# Patient Record
Sex: Female | Born: 1995 | Race: White | Hispanic: No | Marital: Single | Smoking: Never smoker
Health system: Southern US, Community
[De-identification: ages and names within clinical notes are randomized; demographics above are authoritative.]

## PROBLEM LIST (undated history)

## (undated) DIAGNOSIS — E673 Hypervitaminosis D: Secondary | ICD-10-CM

## (undated) DIAGNOSIS — S5290XA Unspecified fracture of unspecified forearm, initial encounter for closed fracture: Secondary | ICD-10-CM

## (undated) DIAGNOSIS — F909 Attention-deficit hyperactivity disorder, unspecified type: Secondary | ICD-10-CM

## (undated) DIAGNOSIS — M8580 Other specified disorders of bone density and structure, unspecified site: Secondary | ICD-10-CM

## (undated) DIAGNOSIS — E063 Autoimmune thyroiditis: Secondary | ICD-10-CM

## (undated) DIAGNOSIS — F419 Anxiety disorder, unspecified: Secondary | ICD-10-CM

## (undated) DIAGNOSIS — R6250 Unspecified lack of expected normal physiological development in childhood: Secondary | ICD-10-CM

## (undated) DIAGNOSIS — E079 Disorder of thyroid, unspecified: Secondary | ICD-10-CM

## (undated) DIAGNOSIS — E049 Nontoxic goiter, unspecified: Secondary | ICD-10-CM

## (undated) DIAGNOSIS — S52209A Unspecified fracture of shaft of unspecified ulna, initial encounter for closed fracture: Secondary | ICD-10-CM

## (undated) HISTORY — DX: Other specified disorders of bone density and structure, unspecified site: M85.80

## (undated) HISTORY — PX: APPENDECTOMY: SHX54

## (undated) HISTORY — DX: Disorder of thyroid, unspecified: E07.9

## (undated) HISTORY — DX: Unspecified lack of expected normal physiological development in childhood: R62.50

## (undated) HISTORY — DX: Nontoxic goiter, unspecified: E04.9

## (undated) HISTORY — DX: Autoimmune thyroiditis: E06.3

## (undated) HISTORY — DX: Hypervitaminosis D: E67.3

## (undated) HISTORY — DX: Anxiety disorder, unspecified: F41.9

## (undated) HISTORY — DX: Unspecified fracture of unspecified forearm, initial encounter for closed fracture: S52.90XA

## (undated) HISTORY — DX: Attention-deficit hyperactivity disorder, unspecified type: F90.9

## (undated) HISTORY — DX: Unspecified fracture of shaft of unspecified ulna, initial encounter for closed fracture: S52.209A

---
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
## 2017-05-16 ENCOUNTER — Telehealth (INDEPENDENT_AMBULATORY_CARE_PROVIDER_SITE_OTHER): Payer: Self-pay | Admitting: "Endocrinology

## 2017-05-16 NOTE — Telephone Encounter (Signed)
°  Who's calling (name and relationship to patient) : Mom/Dana  Best contact number: [phone]  Provider they see: Dr Fransico MichaelBrennan  Reason for call: Mom called in requesting to have lab orders released, pt is going to [facility] on Monday(05/19/17) to have labs drawn and just needed to make sure labs were ready when pt arrives to the lab.

## 2017-05-19 ENCOUNTER — Other Ambulatory Visit (INDEPENDENT_AMBULATORY_CARE_PROVIDER_SITE_OTHER): Payer: Self-pay

## 2017-05-19 DIAGNOSIS — E673 Hypervitaminosis D: Secondary | ICD-10-CM

## 2017-05-20 LAB — CBC WITH DIFFERENTIAL/PLATELET
BASOS ABS: 20 {cells}/uL (ref 0–200)
Basophils Relative: 0.3 %
EOS PCT: 3.2 %
Eosinophils Absolute: 218 cells/uL (ref 15–500)
HCT: 38 % (ref 35.0–45.0)
HEMOGLOBIN: 13.4 g/dL (ref 11.7–15.5)
Lymphs Abs: 1510 cells/uL (ref 850–3900)
MCH: 30 pg (ref 27.0–33.0)
MCHC: 35.3 g/dL (ref 32.0–36.0)
MCV: 85.2 fL (ref 80.0–100.0)
MONOS PCT: 6.5 %
MPV: 10 fL (ref 7.5–12.5)
NEUTROS ABS: 4610 {cells}/uL (ref 1500–7800)
Neutrophils Relative %: 67.8 %
Platelets: 268 10*3/uL (ref 140–400)
RBC: 4.46 10*6/uL (ref 3.80–5.10)
RDW: 12.4 % (ref 11.0–15.0)
Total Lymphocyte: 22.2 %
WBC mixed population: 442 cells/uL (ref 200–950)
WBC: 6.8 10*3/uL (ref 3.8–10.8)

## 2017-05-20 LAB — PTH, INTACT AND CALCIUM
Calcium: 9.4 mg/dL (ref 8.9–10.4)
PTH: 31 pg/mL (ref 14–64)

## 2017-05-20 LAB — TSH: TSH: 1.73 m[IU]/L

## 2017-05-20 LAB — T3, FREE: T3 FREE: 3.5 pg/mL (ref 3.0–4.7)

## 2017-05-20 LAB — IRON: IRON: 32 ug/dL (ref 27–164)

## 2017-05-20 LAB — VITAMIN D 25 HYDROXY (VIT D DEFICIENCY, FRACTURES): Vit D, 25-Hydroxy: 38 ng/mL (ref 30–100)

## 2017-05-20 LAB — T4, FREE: FREE T4: 1.4 ng/dL (ref 0.8–1.4)

## 2017-05-21 ENCOUNTER — Ambulatory Visit (INDEPENDENT_AMBULATORY_CARE_PROVIDER_SITE_OTHER): Payer: BLUE CROSS/BLUE SHIELD | Admitting: "Endocrinology

## 2017-05-21 ENCOUNTER — Encounter (INDEPENDENT_AMBULATORY_CARE_PROVIDER_SITE_OTHER): Payer: Self-pay | Admitting: "Endocrinology

## 2017-05-21 VITALS — BP 112/64 | HR 90 | Ht 66.34 in | Wt 144.0 lb

## 2017-05-21 DIAGNOSIS — E063 Autoimmune thyroiditis: Secondary | ICD-10-CM | POA: Diagnosis not present

## 2017-05-21 DIAGNOSIS — E673 Hypervitaminosis D: Secondary | ICD-10-CM | POA: Diagnosis not present

## 2017-05-21 DIAGNOSIS — R7989 Other specified abnormal findings of blood chemistry: Secondary | ICD-10-CM

## 2017-05-21 DIAGNOSIS — T07XXXA Unspecified multiple injuries, initial encounter: Secondary | ICD-10-CM

## 2017-05-21 DIAGNOSIS — D500 Iron deficiency anemia secondary to blood loss (chronic): Secondary | ICD-10-CM

## 2017-05-21 DIAGNOSIS — E049 Nontoxic goiter, unspecified: Secondary | ICD-10-CM | POA: Diagnosis not present

## 2017-05-21 NOTE — Patient Instructions (Signed)
Follow up visit in 6 months. Please repeat lab tests two weeks prior. Take one Centrum for women or One-A-Day for Women daily.

## 2017-05-21 NOTE — Progress Notes (Signed)
Subjective:  Patient Name: Bethany Dean Date of Birth: Oct 15, 1995  MRN: [id number]  Bethany Dean  presents to the office today for follow-up of her fractures, goiter, abnormal thyroid tests, fatigue, thyroiditis, hypervitaminosis D,  and iron deficiency anemia.  HISTORY OF PRESENT ILLNESS:   Bethany Dean is a 21 y.o. Caucasian young lady.  Bethany Dean was unaccompanied.  1. Patient was first referred to me on 07/23/2004 by her primary care pediatrician, Dr. Darrin NipperKathleen Riley [place], after having had four fractures within 18 months in the setting of a family history of osteoporosis.   A. The child was then 746-1/63 years old. The perinatal history showed that this was the third child for this mother. Child was born at term with a birth weight of 9 lbs. 13 oz. She took vitamin D supplementation while she was breast fed until age 72-1/2 months, but then was without any multivitamins or vitamin D at all. Her first fracture occurred when she was 21 years old. This was a fracture of the left radius, which occurred after a fall from gymnastic rings. She had a second fracture at the original site 3 months later during a sliding accident. She had a third fracture when she fell backward off a chair and fractured her left wrist. Her fourth fracture within 18 months occurred when she tripped on a post and fell forward, fracturing her right ulna. Her dietary intake of calcium had been low, because she had been chronically constipated since infancy. She didn't drink much milk. She also didn't eat much cheese or ice cream. Dr. Victory Dakiniley had started her on Os-Cal, 500 mg twice daily 2 weeks prior to her visit. The child was a very active little girl. She played on a local soccer team. Family history was positive for osteoporosis in the maternal grandmother and maternal great-grandmother. Both were on Fosamax. The maternal great grandmother had also had a fracture. Paternal grandmother and paternal grandfather had  diabetes. Paternal grandfather and maternal grandmother both had thyroid disease.   B. On physical examination her height was at about the 78th percentile. Her weight was at about the 53rd percentile. Her thyroid gland was borderline enlarged. Her right arm was then in a cast. She had normal muscle strength and tone. Laboratory data showed normal thyroid tests. Calcium was 10.5 and PTH was 9.1. Her 25-hydroxy vitamin D was 82. Her 1,25-dihydroxy vitamin D was 33.  Although these laboratory tests were obtained several weeks after beginning of Os-Cal with D, it did not appear as if she had ever had hyperparathyroidism or very severe calcium and vitamin D deficiency. Her BMD study performed on 08/07/04 showed that her total body and spine BMD were normal for her chronologic age. It was possible that she just had had the bad luck to have several fractures of her wrists and forearms as a result of trauma. I decided to follow her clinically over time.   2. During the past 12 years her baseline normal bone mineral density has gradually but progressively improved. A follow up bone mineral density study on 08/15/2005 showed that her overall total bone density had improved. The overall total body bone density was at approximately the 50th percentile. Her bone density of the lumbar spine was at about the 80th percentile. A third bone density study on 08/11/2007 showed an even greater improvement in bone mineral density. She did have elevated vitamin D levels for a period of  time, but the levels normalized once her vitamin D intake was  reduced. During this period of time the patient has had waxing and waning of her thyroid gland size consistent with Hashimoto's thyroiditis. She's also had fluctuations of her thyroid tests consistent with Hashimoto's disease. She has generally remained euthyroid, however. She was diagnosed with ADHD several  years ago. She has been treated with Concerta and Intuniv, but the Intuniv was later  discontinued..  3. The patient's last [facility] visit was on 11/19/16. In the interim, she has been healthy. She has not had any more fractures. She takes her Gummi vitamins regularly. She is eating well. She stays active and walks a lot. She only takes Concerta when she has tests.   4. Pertinent Review of Systems:  Constitutional: The patient feels "good".  She has not been fatigued. Her stamina is good. Energy level is good. Body temperature is normal.  Eyes: Vision is better when she wears her glasses. There are no other recognized eye problems. Neck: She has had some superior sore throat symptoms recently, but no anterior neck symptoms. She has no complaints of anterior neck swelling, soreness, tenderness, pressure, discomfort, or difficulty swallowing.   Heart: Heart rate increases with exercise or other physical activity. The patient has no complaints of palpitations, irregular heart beats, chest pain, or chest pressure.   Gastrointestinal: Bowel movents seem normal. The patient has no complaints of excessive hunger, acid reflux, upset stomach, stomach aches or pains, diarrhea, or constipation.  Legs: Muscle mass and strength seem normal. There are no complaints of numbness, tingling, burning, or pain. No edema is noted.  Feet: There are no obvious foot problems now. There are no complaints of numbness, tingling, burning, or pain. No edema is noted. Neurologic: There are no recognized problems with muscle movement and strength, sensation, or coordination. GYN: LMP was 2 weeks ago. Menses have been regular at every 4 weeks. Periods are not too heavy. She is not using birth control.   PAST MEDICAL, FAMILY, AND SOCIAL HISTORY  Past Medical History:  Diagnosis Date  . ADHD (attention deficit hyperactivity disorder)   . Forearm fractures, both bones, closed   . Goiter   . Hashimoto's disease   . Hypervitaminosis D   . Osteopenia   . Physical growth delay   . Physical growth delay   .  Thyroiditis, autoimmune     Family History  Problem Relation Age of Onset  . Diabetes Maternal Grandmother   . Osteoporosis Maternal Grandmother   . Hypothyroidism Maternal Grandmother   . Thyroid disease Maternal Grandmother   . Hypothyroidism Maternal Grandfather   . Thyroid disease Maternal Grandfather   . Diabetes Paternal Grandfather      Current Outpatient Medications:  .  methylphenidate (CONCERTA) 54 MG CR tablet, Take 72 mg by mouth every morning.  , Disp: , Rfl:  .  methylphenidate 36 MG PO CR tablet, Take 36 mg by mouth daily., Disp: , Rfl:  .  oxyCODONE (OXY IR/ROXICODONE) 5 MG immediate release tablet, Take 1-2 tablets (5-10 mg total) by mouth every 4 (four) hours as needed for moderate pain. (Patient not taking: Reported on 11/19/2016), Disp: 30 tablet, Rfl: 0  Allergies as of 05/21/2017  . (No Known Allergies)    reports that  has never smoked. she has never used smokeless tobacco. She reports that she does not drink alcohol or use drugs. Pediatric History  Patient Guardian Status  . Mother:  Seleny, Allbright  . Father:  Lezlie, Ritchey   Other Topics Concern  . Not on file  Social History  Narrative   Is in 8th grade at [employer]   Lives with parents, and 2 sisters   Plays tennis   1. 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 She is no longer sure if she wants to be a Clinical research associate, so she is taking more business administration courses now. Her paternal aunt was previously diagnosed with lupus. Both maternal grandparents had thyroid disease. Her boyfriend is a Chiropractor who is a Holiday representative. He will be commissioned in the [employer] after graduation. He wants to be an Administrator, sports.  2. Activities: She walks everywhere at school. She still plays the keyboard, but not as much. She no longer plays much tennis. She is very active. 3. Primary Care Provider: Dr. Earlene Plater, [facility]  REVIEW OF SYSTEMS: There are no other significant problems involving Dajuana's other body systems.   Objective:  Vital Signs:  Ht 5' 6.34" (1.685 m)   Wt 144 lb (65.3 kg)   BMI 23.01 kg/m    Ht Readings from Last 3 Encounters:  05/21/17 5' 6.34" (1.685 m) (79 %, Z= 0.81)*  11/19/16 5' 6.46" (1.688 m) (81 %, Z= 0.86)*  06/29/16 5\' 6"  (1.676 m) (75 %, Z= 0.69)*   * Growth percentiles are based on CDC (Girls, 2-20 Years) data.   Wt Readings from Last 3 Encounters:  05/21/17 144 lb (65.3 kg) (76 %, Z= 0.69)*  11/19/16 127 lb (57.6 kg) (53 %, Z= 0.06)*  06/29/16 126 lb 15.8 oz (57.6 kg) (54 %, Z= 0.11)*   * Growth percentiles are based on CDC (Girls, 2-20 Years) data.   Body surface area is 1.75 meters squared.  PHYSICAL EXAM:  Constitutional: The patient appears healthy, relatively tall, but no longer quite slender. The patient's height has plateaued at about the 79.04%. Her weight has increased 17 pounds and her weight percentile has increased to the 75.64%. Her BMI has increased to the 65.32%. She is bright and alert. She engages well. Her affect and insight are quite normal. She looks good today. Head: The head is normocephalic. Face: The face appears normal. There are no obvious dysmorphic features. She has mild acne. Eyes: There is no obvious arcus or proptosis. Moisture appears normal. Mouth: The oropharynx and tongue appear normal. Dentition appears to be normal for age. Oral moisture is normal. Neck: The neck appears to be visibly normal. No carotid bruits are noted. The thyroid gland has shrunk back to normal size of 18-20 grams. The consistency of the thyroid gland is normal. The thyroid gland is not tender to palpation. Lungs: The lungs are clear to auscultation. Air movement is good. Heart: Heart rate and rhythm are regular. Heart sounds S1 and S2 are normal. I did not appreciate any pathologic cardiac murmurs. Abdomen: The abdomen is normal in size for the  patient's age. Bowel sounds are normal. There is no obvious hepatomegaly, splenomegaly, or other mass effect.  Arms: Muscle size and bulk are normal for age. Hands: There is no obvious tremor. Phalangeal and metacarpophalangeal joints are normal. Palmar muscles are normal for age. Palmar skin is pale. Palmar moisture is normal.  Legs: Muscles appear normal for age. No edema is present. Neurologic: Strength is normal for age in both the upper and lower extremities. Muscle tone is normal. Sensation to touch is normal in both legs.    LAB DATA:   Labs 05/19/17: TSH 1.73, free T4  1.4, free T3 1.5; PTH 31, calcium 9.4, 25-OH vitamin D 38; iron 32; CBC normal  Labs 11/14/16: TSH 2.17, free T4 1.3, free T3 3.9; calcium 9.4, PTH 29, 25-OH vitamin D 51;  iron 38 (decreased from 55), CBC normal  Labs 04/24/16: TSH 1.41, free T4 1.4, free T3 3.1  Labs 05/08/15: TSH 2.232, free T4 1.41, free T3 3.1, TPO antibody 52 (normal <9)  Labs 10/27/14: PTH 36, calcium 9.4, 25-OH vitamin D 44; TSH 1.543, free T4 1.36, free T3 3.7  Labs 04/18/14: PTH 40, calcium 9.6, 25-hydroxy vitamin D 45; TSH 2.099, free T4 1.37, free T3 3.9  Labs 01/04/13: Calcium 9.8, PTH 22.7, 25-hydroxy vitamin D 87 (increased from 67); TSH 3.372, free T4 1.41, free T3 4.1  Labs 03/12/12: Free T4 1.21, free T3 4.3, HbA1c 5.5%, PTH 25.7, calcium 10.1, 25-hydroxy vitamin D 49  Labs 07/31/11: TSH 1.746. Free T4 1.54. Free T3 4.2;  PTH 25.2. Calcium 9.9, 25-hydroxy vitamin D 49   Assessment and Plan:   ASSESSMENT:  1-3. Goiter/abnormal thyroid blood tests/thyroiditis:   A. The thyroid gland has varied significantly in size over time, sometimes larger and sometimes smaller. Today the thyroid gland has shrunk back to normal size. The waxing and waning of thyroid gland size is c/w evolving Hashimoto's thyroiditis.   B. Her TPO antibody in December 2016 was elevated, c/w Hashimoto's disease. Her thyroiditis is clinically quiescent today  C. Her  TFTs in the past 4 years have varied from mid-range normal to mildly hypothyroid. Those shifts are also c/w evolving Hashimoto's Dz. Her TFTs are mid-normal now.   D. It is still quite likely that she will become hypothyroid at some time in the future.  4. Weight loss: Resolved  5. Poor appetite: Resolved. Her appetite is too good.    6. Hypervitaminosis D:   A. Her vitamin D level has also varied over time, but has been normal for the past 5 years. The vitamin D level is lower this month.   B. In the past she appeared to be a rapid absorber of vitamin D and/or an active producer of vitamin D when her skin is exposed to the sun. 7. Fractures: This problem has resolved. 8. Fatigue: Resolved  9. Pallor: Resolved.  10 Iron deficiency anemia: Her CBC is normal now, but her iron has decreased more.S he needs an MVI with iron daily, such as Centrum for Women or One-a-Day for Women,  PLAN:  1. Diagnostic:  Repeat the calcium, PTH, 25 hydroxy vitamin D, TFTs, CBC, and iron in 6 months.   2. Therapeutic: Continue what she is doing in terms of consuming dairy products. I would like her to resume taking an MVI daily to ensure that she has enough calcium and vitamin D to continue to increase her bone density during the young adult years and to ensure that she has enough iron intake to compensate for the loss of iron during menses.  3. Patient education: We talked about the issues of thyroiditis, hypothyroidism, bone mineral accretion, and iron deficiency anemia.   4. Follow-up: 6 months  Level of Service: This visit lasted in excess of 50 minutes. More than 50% of the visit was devoted to counseling.  Molli KnockMichael Lenni Reckner, MD, CDE Adult and Pediatric Endocrinology

## 2017-06-02 ENCOUNTER — Encounter (INDEPENDENT_AMBULATORY_CARE_PROVIDER_SITE_OTHER): Payer: Self-pay | Admitting: *Deleted

## 2017-10-22 ENCOUNTER — Telehealth (INDEPENDENT_AMBULATORY_CARE_PROVIDER_SITE_OTHER): Payer: Self-pay | Admitting: "Endocrinology

## 2017-10-22 NOTE — Telephone Encounter (Signed)
°  Who's calling (name and relationship to patient) : [name] (Mother) Best contact number: [phone] Provider they see: Dr. Fransico Michael Reason for call: Mom requested for lab orders to be sent to [facility] in [facility] for pt's upcoming appt.

## 2017-10-22 NOTE — Telephone Encounter (Signed)
Returned TC to mother [name] to advise that labs are in the system and [facility] Loh can see them in their system.

## 2017-11-13 ENCOUNTER — Telehealth (INDEPENDENT_AMBULATORY_CARE_PROVIDER_SITE_OTHER): Payer: Self-pay | Admitting: "Endocrinology

## 2017-11-13 ENCOUNTER — Other Ambulatory Visit (INDEPENDENT_AMBULATORY_CARE_PROVIDER_SITE_OTHER): Payer: Self-pay | Admitting: *Deleted

## 2017-11-13 DIAGNOSIS — E063 Autoimmune thyroiditis: Secondary | ICD-10-CM

## 2017-11-13 DIAGNOSIS — E673 Hypervitaminosis D: Secondary | ICD-10-CM

## 2017-11-13 DIAGNOSIS — R7989 Other specified abnormal findings of blood chemistry: Secondary | ICD-10-CM

## 2017-11-13 NOTE — Telephone Encounter (Signed)
°  Who's calling (name and relationship to patient) : Annabelle HarmanDana (mom) Best contact number: [phone] Provider they see: Fransico MichaelBrennan Reason for call: Mom calling labs to be pick in for patient.     PRESCRIPTION REFILL ONLY  Name of prescription:  Pharmacy:

## 2017-11-13 NOTE — Telephone Encounter (Signed)
Call to mom Bethany HarmanDana- advised labs are still in the computer and order is good until 11/19/17 She reports she went yesterday and was told they were not in- She does plan to go Monday or Tues. RN will confirm with our tech labs are visible and if not will re-enter RN asked [facility] in our office if she can see the orders- she reports yes- not sure if tech looked at the month and year and not the actual date. Decided will print orders and fax to [facility] and [facility] just to make sure they note the date. Call back to mom and adv as above also adv per lab tech patient can go online and schedule an appt.   [facility]

## 2017-11-13 NOTE — Telephone Encounter (Signed)
°  Who's calling (name and relationship to patient) : Donnella ShamYates,Dana (Mother)  Best contact number: [phone]   Provider they see: Fransico MichaelBrennan  Reason for call: Mother called stating she was told by the representative at [facility] that due to the lab order being dated in 2018 they are unable to use it in 2019. She is requesting that a new order be sent to [facility]. Please call once new order has been entered.

## 2017-11-13 NOTE — Telephone Encounter (Signed)
LVM to advise that lab orders have been added to the system. Call us if you have any quesitons.

## 2017-11-18 LAB — CBC WITH DIFFERENTIAL/PLATELET
BASOS ABS: 40 {cells}/uL (ref 0–200)
Basophils Relative: 0.5 %
Eosinophils Absolute: 208 cells/uL (ref 15–500)
Eosinophils Relative: 2.6 %
HCT: 39.3 % (ref 35.0–45.0)
HEMOGLOBIN: 13.9 g/dL (ref 11.7–15.5)
LYMPHS ABS: 1768 {cells}/uL (ref 850–3900)
MCH: 30 pg (ref 27.0–33.0)
MCHC: 35.4 g/dL (ref 32.0–36.0)
MCV: 84.9 fL (ref 80.0–100.0)
MPV: 9.7 fL (ref 7.5–12.5)
Monocytes Relative: 4.7 %
NEUTROS ABS: 5608 {cells}/uL (ref 1500–7800)
NEUTROS PCT: 70.1 %
Platelets: 297 10*3/uL (ref 140–400)
RBC: 4.63 10*6/uL (ref 3.80–5.10)
RDW: 12.2 % (ref 11.0–15.0)
Total Lymphocyte: 22.1 %
WBC mixed population: 376 cells/uL (ref 200–950)
WBC: 8 10*3/uL (ref 3.8–10.8)

## 2017-11-18 LAB — VITAMIN D 25 HYDROXY (VIT D DEFICIENCY, FRACTURES): Vit D, 25-Hydroxy: 46 ng/mL (ref 30–100)

## 2017-11-18 LAB — T3, FREE: T3 FREE: 3.8 pg/mL (ref 3.0–4.7)

## 2017-11-18 LAB — IRON: Iron: 63 ug/dL (ref 27–164)

## 2017-11-18 LAB — TSH: TSH: 3.89 m[IU]/L

## 2017-11-18 LAB — T4, FREE: Free T4: 1.3 ng/dL (ref 0.8–1.4)

## 2017-11-18 LAB — PTH, INTACT AND CALCIUM
Calcium: 9.9 mg/dL (ref 8.9–10.4)
PTH: 33 pg/mL (ref 14–64)

## 2017-11-19 ENCOUNTER — Ambulatory Visit (INDEPENDENT_AMBULATORY_CARE_PROVIDER_SITE_OTHER): Payer: BLUE CROSS/BLUE SHIELD | Admitting: "Endocrinology

## 2017-11-21 ENCOUNTER — Ambulatory Visit (INDEPENDENT_AMBULATORY_CARE_PROVIDER_SITE_OTHER): Payer: BLUE CROSS/BLUE SHIELD | Admitting: "Endocrinology

## 2017-12-10 ENCOUNTER — Ambulatory Visit (INDEPENDENT_AMBULATORY_CARE_PROVIDER_SITE_OTHER): Payer: PRIVATE HEALTH INSURANCE | Admitting: "Endocrinology

## 2017-12-10 ENCOUNTER — Encounter (INDEPENDENT_AMBULATORY_CARE_PROVIDER_SITE_OTHER): Payer: Self-pay | Admitting: "Endocrinology

## 2017-12-10 VITALS — BP 110/60 | HR 60 | Ht 66.61 in | Wt 151.4 lb

## 2017-12-10 DIAGNOSIS — E063 Autoimmune thyroiditis: Secondary | ICD-10-CM

## 2017-12-10 DIAGNOSIS — R7989 Other specified abnormal findings of blood chemistry: Secondary | ICD-10-CM

## 2017-12-10 DIAGNOSIS — E049 Nontoxic goiter, unspecified: Secondary | ICD-10-CM | POA: Diagnosis not present

## 2017-12-10 DIAGNOSIS — E559 Vitamin D deficiency, unspecified: Secondary | ICD-10-CM | POA: Diagnosis not present

## 2017-12-10 DIAGNOSIS — D508 Other iron deficiency anemias: Secondary | ICD-10-CM | POA: Diagnosis not present

## 2017-12-10 NOTE — Progress Notes (Signed)
Subjective:  Patient Name: Bethany Dean Date of Birth: 11-Feb-1996  MRN: [id number]  Bethany Dean  presents to the office today for follow-up of her fractures, goiter, abnormal thyroid tests, fatigue, thyroiditis, hypervitaminosis D,  and iron deficiency anemia.  HISTORY OF PRESENT ILLNESS:   Bethany Dean is a 22 y.o. Caucasian young lady.  Bethany Dean was unaccompanied.  1. Patient was first referred to me on 07/23/2004 by her primary care pediatrician, Dr. Darrin NipperKathleen Riley [place], after having had four fractures within 18 months in the setting of a family history of osteoporosis.   A. The child was then 696-1/52 years old. The perinatal history showed that this was the third child for this mother. Child was born at term with a birth weight of 9 lbs. 13 oz. She took vitamin D supplementation while she was breast fed until age 53-1/2 months, but then was without any multivitamins or vitamin D at all. Her first fracture occurred when she was 22 years old. This was a fracture of the left radius, which occurred after a fall from gymnastic rings. She had a second fracture at the original site 3 months later during a sliding accident. She had a third fracture when she fell backward off a chair and fractured her left wrist. Her fourth fracture within 18 months occurred when she tripped on a post and fell forward, fracturing her right ulna. Her dietary intake of calcium had been low, because she had been chronically constipated since infancy. She didn't drink much milk. She also didn't eat much cheese or ice cream. Dr. Victory Dakiniley had started her on Os-Cal, 500 mg twice daily 2 weeks prior to her visit. The child was a very active little girl. She played on a local soccer team. Family history was positive for osteoporosis in the maternal grandmother and maternal great-grandmother. Both were on Fosamax. The maternal great grandmother had also had a fracture. Paternal grandmother and paternal grandfather had  diabetes. Paternal grandfather and maternal grandmother both had thyroid disease.   B. On physical examination her height was at about the 78th percentile. Her weight was at about the 53rd percentile. Her thyroid gland was borderline enlarged. Her right arm was then in a cast. She had normal muscle strength and tone. Laboratory data showed normal thyroid tests. Calcium was 10.5 and PTH was 9.1. Her 25-hydroxy vitamin D was 82. Her 1,25-dihydroxy vitamin D was 33.  Although these laboratory tests were obtained several weeks after beginning of Os-Cal with D, it did not appear as if she had ever had hyperparathyroidism or very severe calcium and vitamin D deficiency. Her BMD study performed on 08/07/04 showed that her total body and spine BMD were normal for her chronologic age. It was possible that she just had had the bad luck to have several fractures of her wrists and forearms as a result of trauma. I decided to follow her clinically over time.   2. During the past 13 years her baseline normal bone mineral density has gradually but progressively improved. A follow up bone mineral density study on 08/15/2005 showed that her overall total bone density had improved. The overall total body bone density was at approximately the 50th percentile. Her bone density of the lumbar spine was at about the 80th percentile. A third bone density study on 08/11/2007 showed an even greater improvement in bone mineral density. She did have elevated vitamin D levels for a period of  time, but the levels normalized once her vitamin D intake was  reduced. During this period of time the patient has also had waxing and waning of her thyroid gland size consistent with Hashimoto's thyroiditis. She's also had fluctuations of her thyroid tests consistent with Hashimoto's disease. She has generally remained euthyroid, however. She was diagnosed with ADHD several  years ago. She has been treated with Concerta and Intuniv, but the Intuniv was  later discontinued..  3. The patient's last [facility] visit was on 05/21/17. In the interim, she has been healthy. She has not had any more fractures. She often misses MVI doses. She is eating well. She stays active, but no longer walks as much as she did formerly. She only takes Concerta when she has tests.   4. Pertinent Review of Systems:  Constitutional: The patient feels "good".  She has not been fatigued. Her stamina is good. Energy level is good. Body temperature is normal.  Eyes: Vision is better when she wears her glasses. There are no other recognized eye problems. Neck: She has not had any complaints of anterior neck swelling, soreness, tenderness, pressure, discomfort, or difficulty swallowing.   Heart: Heart rate increases with exercise or other physical activity. The patient has no complaints of palpitations, irregular heart beats, chest pain, or chest pressure.   Gastrointestinal: Bowel movents seem normal. The patient has no complaints of excessive hunger, acid reflux, upset stomach, stomach aches or pains, diarrhea, or constipation.  Legs: Muscle mass and strength seem normal. There are no complaints of numbness, tingling, burning, or pain. No edema is noted.  Feet: There are no obvious foot problems now. There are no complaints of numbness, tingling, burning, or pain. No edema is noted. Neurologic: There are no recognized problems with muscle movement and strength, sensation, or coordination.   PAST MEDICAL, FAMILY, AND SOCIAL HISTORY  Past Medical History:  Diagnosis Date  . ADHD (attention deficit hyperactivity disorder)   . Forearm fractures, both bones, closed   . Goiter   . Hashimoto's disease   . Hypervitaminosis D   . Osteopenia   . Physical growth delay   . Physical growth delay   . Thyroiditis, autoimmune     Family History  Problem Relation Age of Onset  . Diabetes Maternal Grandmother   . Osteoporosis Maternal Grandmother   . Hypothyroidism Maternal  Grandmother   . Thyroid disease Maternal Grandmother   . Hypothyroidism Maternal Grandfather   . Thyroid disease Maternal Grandfather   . Diabetes Paternal Grandfather      Current Outpatient Medications:  .  methylphenidate 36 MG PO CR tablet, Take 36 mg by mouth daily., Disp: , Rfl:   Allergies as of 12/10/2017  . (No Known Allergies)    reports that she has never smoked. She has never used smokeless tobacco. She reports that she does not drink alcohol or use drugs. Pediatric History  Patient Guardian Status  . Mother:  Onika, Gudiel  . Father:  Donabelle, Molden   Other Topics Concern  . Not on file  Social History Narrative   Is in 8th grade at [employer]   Lives with parents, and 2 sisters   Plays tennis   1. School and family: She has almost finished  her junior year at [employer], she may graduate next Summer in 2020. She is now also working at [employer]. She is still interested in a career as a Clinical research associate. Her boyfriend graduated from Social worker school and is in the [employer] now. He wants to be an Administrator, sports. Her paternal aunt was  previously diagnosed with lupus. Both maternal grandparents had thyroid disease.  2. Activities: She has been very busy, but is not exercising much per se.  3. Primary Care Provider: Dr. Earlene Plater, [facility]  REVIEW OF SYSTEMS: There are no other significant problems involving Xiomara's other body systems.   Objective:  Vital Signs:  BP 110/60   Pulse 60   Ht 5' 6.61" (1.692 m)   Wt 151 lb 6.4 oz (68.7 kg)   LMP 12/02/2017   BMI 23.99 kg/m    Ht Readings from Last 3 Encounters:  12/10/17 5' 6.61" (1.692 m) (82 %, Z= 0.91)*  05/21/17 5' 6.34" (1.685 m) (79 %, Z= 0.81)*  11/19/16 5' 6.46" (1.688 m) (81 %, Z= 0.86)*   * Growth percentiles are based on CDC (Girls, 2-20 Years) data.   Wt Readings from Last 3 Encounters:  12/10/17 151 lb 6.4 oz (68.7 kg) (81 %, Z= 0.89)*  05/21/17 144 lb (65.3 kg) (76 %, Z= 0.69)*   11/19/16 127 lb (57.6 kg) (53 %, Z= 0.06)*   * Growth percentiles are based on CDC (Girls, 2-20 Years) data.   Body surface area is 1.8 meters squared.  PHYSICAL EXAM:  Constitutional: Crestina appears healthy, relatively tall, and well nourished.  Her height has plateaued at about the 81.84%. Her weight has increased 7 pounds after two recent vacations. Her weight percentile has increased to the 81.27%. Her BMI has increased to the 72.26%. She is bright and alert. She engages well. Her affect and insight are quite normal. She looks good today. Head: The head is normocephalic. Face: The face appears normal. There are no obvious dysmorphic features. She has mild acne. Eyes: There is no obvious arcus or proptosis. Moisture appears normal. Mouth: The oropharynx and tongue appear normal. Dentition appears to be normal for age. Oral moisture is normal. Neck: The neck appears to be visibly normal. No carotid bruits are noted. The thyroid gland has increased slightly in size in the past 6 months. Her right lobe is well within normal limits, but her left lobe is larger. The  overall thyroid gland size is about 20-20+ grams. The consistency of the thyroid gland is normal. The thyroid gland is not tender to palpation. Lungs: The lungs are clear to auscultation. Air movement is good. Heart: Heart rate and rhythm are regular. Heart sounds S1 and S2 are normal. I did not appreciate any pathologic cardiac murmurs. Abdomen: The abdomen is normal in size for the patient's age. Bowel sounds are normal. There is no obvious hepatomegaly, splenomegaly, or other mass effect.  Arms: Muscle size and bulk are normal for age. Hands: There is no obvious tremor. Phalangeal and metacarpophalangeal joints are normal. Palmar muscles are normal for age. Palmar skin is pale. Palmar moisture is normal.  Legs: Muscles appear normal for age. No edema is present. Neurologic: Strength is normal for age in both the upper and lower  extremities. Muscle tone is normal. Sensation to touch is normal in both legs.    LAB DATA:   Labs 11/17/17: TSH 3.89, free T4 1.3, free T3 3.8; PTH 33, calcium 9.9. 25-OH vitamin D 46; CBC normal  Labs 05/19/17: TSH 1.73, free T4 1.4, free T3 3.5; PTH 31, calcium 9.4, 25-OH vitamin D 38; iron 32; CBC normal  Labs 11/14/16: TSH 2.17, free T4 1.3, free T3 3.9; calcium 9.4, PTH 29, 25-OH vitamin D 51;  iron 38 (decreased from 55), CBC normal  Labs 04/24/16: TSH 1.41, free T4 1.4,  free T3 3.1  Labs 05/08/15: TSH 2.232, free T4 1.41, free T3 3.1, TPO antibody 52 (normal <9)  Labs 10/27/14: PTH 36, calcium 9.4, 25-OH vitamin D 44; TSH 1.543, free T4 1.36, free T3 3.7  Labs 04/18/14: PTH 40, calcium 9.6, 25-hydroxy vitamin D 45; TSH 2.099, free T4 1.37, free T3 3.9  Labs 01/04/13: Calcium 9.8, PTH 22.7, 25-hydroxy vitamin D 87 (increased from 67); TSH 3.372, free T4 1.41, free T3 4.1  Labs 03/12/12: Free T4 1.21, free T3 4.3, HbA1c 5.5%, PTH 25.7, calcium 10.1, 25-hydroxy vitamin D 49  Labs 07/31/11: TSH 1.746. Free T4 1.54. Free T3 4.2;  PTH 25.2. Calcium 9.9, 25-hydroxy vitamin D 49   Assessment and Plan:   ASSESSMENT:  1-3. Goiter/abnormal thyroid blood tests/thyroiditis:   A. The thyroid gland has varied significantly in size over time, sometimes larger and sometimes smaller. Today the thyroid gland is a bit larger than at her last visit, just at or slightly above the upper limit of normal size. The waxing and waning of thyroid gland size is c/w evolving Hashimoto's thyroiditis.   B. Her TPO antibody in December 2016 was elevated, c/w Hashimoto's disease. Her thyroiditis is clinically quiescent today  C. Her TFTs in the past 4 years have varied from mid-range normal to mildly hypothyroid. Those shifts are also c/w evolving Hashimoto's Dz. Her TSH is mildly elevated now, her free T4 is a bit lower, and her free T3 is higher. This pattern is c/w a recent flare up of thyroiditis.   D. It is  still quite likely that she will become hypothyroid at some time in the future.  4. Weight loss: Resolved  5. Poor appetite: Resolved. Her appetite is sometimes too good.    6. Hypervitaminosis D:   A. Her vitamin D level has also varied over time, but has been normal for the past 5 years. The vitamin D level is higher this month, but still <50% of the normal vitamin D range.    B. In the past she appeared to be a rapid absorber of vitamin D and/or an active producer of vitamin D when her skin is exposed to the sun. 7. Fractures: This problem has resolved. 8. Fatigue: Resolved  9. Pallor: Resolved.  10 Iron deficiency anemia: Her CBC is normal once again. She needs an MVI with iron daily, such as Centrum for Women or One-a-Day for Women,  PLAN:  1. Diagnostic:  Repeat the TFTs in 6 months.   2. Therapeutic: Continue what she is doing in terms of consuming dairy products. I would like her to resume taking an MVI daily to ensure that she has enough calcium and vitamin D to continue to increase her bone density during the young adult years and to ensure that she has enough iron intake to compensate for the loss of iron during menses.  3. Patient education: We talked about the issues of thyroiditis, hypothyroidism, bone mineral accretion, osteoporosis, and iron deficiency anemia.   4. Follow-up: 6 months  Level of Service: This visit lasted in excess of 50 minutes. More than 50% of the visit was devoted to counseling.  Molli Knock, MD, CDE Adult and Pediatric Endocrinology

## 2017-12-10 NOTE — Patient Instructions (Signed)
Follow up visit in 6 months. Please repeat lab tests about one week prior.  

## 2018-06-17 ENCOUNTER — Telehealth (INDEPENDENT_AMBULATORY_CARE_PROVIDER_SITE_OTHER): Payer: Self-pay | Admitting: "Endocrinology

## 2018-06-17 DIAGNOSIS — R7989 Other specified abnormal findings of blood chemistry: Secondary | ICD-10-CM

## 2018-06-17 NOTE — Telephone Encounter (Signed)
°  Who's calling (name and relationship to patient): Daiquiri Mechanic, mom  Best contact number: [phone]  Provider they see: Dr. Fransico Michael  Reason for call: Mom rescheduled Bethany Dean's 06/2018 apt for an apt in April, wondering if the labs have been called in for January and if they would still be good for the April apt. Would like to make sure they are called in before she comes in, please update patient.      PRESCRIPTION REFILL ONLY  Name of prescription:  Pharmacy:

## 2018-06-18 NOTE — Telephone Encounter (Signed)
Spoke with mom and let her know that the labs were entered and she can come into the office, or have them drawn at any [facility] location. Mom states she typically has them drawn in [facility], but cannot recall if it is a [facility], or a [facility]. Informed mom if it is a [facility] its no problem, but she will need to give [place] a call back and let us know so we can change the order. Mom states understanding and ended the call.

## 2018-07-03 ENCOUNTER — Ambulatory Visit (INDEPENDENT_AMBULATORY_CARE_PROVIDER_SITE_OTHER): Payer: PRIVATE HEALTH INSURANCE | Admitting: "Endocrinology

## 2018-09-18 ENCOUNTER — Ambulatory Visit (INDEPENDENT_AMBULATORY_CARE_PROVIDER_SITE_OTHER): Payer: PRIVATE HEALTH INSURANCE | Admitting: "Endocrinology

## 2018-11-02 ENCOUNTER — Telehealth (INDEPENDENT_AMBULATORY_CARE_PROVIDER_SITE_OTHER): Payer: Self-pay | Admitting: "Endocrinology

## 2018-11-02 NOTE — Telephone Encounter (Signed)
°  Who's calling (name and relationship to patient) : [name], mother (DPR on file)  Best contact number: [phone]  Provider they see: Fransico Michael  Reason for call: Please re-order labs so they will not be expired when patient has them drawn in July.      PRESCRIPTION REFILL ONLY  Name of prescription:  Pharmacy:

## 2018-11-03 ENCOUNTER — Other Ambulatory Visit (INDEPENDENT_AMBULATORY_CARE_PROVIDER_SITE_OTHER): Payer: Self-pay | Admitting: *Deleted

## 2018-11-03 DIAGNOSIS — R7989 Other specified abnormal findings of blood chemistry: Secondary | ICD-10-CM

## 2018-11-03 NOTE — Telephone Encounter (Signed)
Sent lab orders as requested.

## 2018-12-17 LAB — T3, FREE: T3, Free: 3.2 pg/mL (ref 3.0–4.7)

## 2018-12-17 LAB — T4, FREE: Free T4: 1.2 ng/dL (ref 0.8–1.4)

## 2018-12-17 LAB — TSH: TSH: 2.98 mIU/L

## 2018-12-21 ENCOUNTER — Other Ambulatory Visit: Payer: Self-pay

## 2018-12-21 ENCOUNTER — Ambulatory Visit (INDEPENDENT_AMBULATORY_CARE_PROVIDER_SITE_OTHER): Payer: PRIVATE HEALTH INSURANCE | Admitting: "Endocrinology

## 2018-12-21 DIAGNOSIS — E673 Hypervitaminosis D: Secondary | ICD-10-CM

## 2018-12-21 DIAGNOSIS — R7989 Other specified abnormal findings of blood chemistry: Secondary | ICD-10-CM

## 2018-12-21 DIAGNOSIS — E063 Autoimmune thyroiditis: Secondary | ICD-10-CM | POA: Diagnosis not present

## 2018-12-21 DIAGNOSIS — E049 Nontoxic goiter, unspecified: Secondary | ICD-10-CM | POA: Diagnosis not present

## 2018-12-21 DIAGNOSIS — D508 Other iron deficiency anemias: Secondary | ICD-10-CM

## 2018-12-21 NOTE — Progress Notes (Signed)
Subjective:  Patient Name: Bethany Dean Date of Birth: May 22, 1996  MRN: [id number]  Bethany SchoolsMeredith Landstrom  presents for her WebEx visit  today for follow-up of her previous history of fractures, goiter, abnormal thyroid tests, fatigue, thyroiditis, hypervitaminosis D,  and iron deficiency anemia.  HISTORY OF PRESENT ILLNESS:   Bethany Dean is a 23 y.o. Caucasian young lady.  Bethany Dean was unaccompanied.  1. Patient was first referred to me on 07/23/2004 by her primary care pediatrician, Dr. Darrin NipperKathleen Riley [place], after having had four fractures within 18 months in the setting of a family history of osteoporosis.   A. The child was then 1066-1/29 years old. The perinatal history showed that this was the third child for this mother. Child was born at term with a birth weight of 9 lbs. 13 oz. She took vitamin D supplementation while she was breast fed until age 106-1/2 months, but then was without any multivitamins or vitamin D at all. Her first fracture occurred when she was 23 years old. This was a fracture of the left radius, which occurred after a fall from gymnastic rings. She had a second fracture at the original site 3 months later during a sliding accident. She had a third fracture when she fell backward off a chair and fractured her left wrist. Her fourth fracture within 18 months occurred when she tripped on a post and fell forward, fracturing her right ulna. Her dietary intake of calcium had been low, because she had been chronically constipated since infancy. She didn't drink much milk. She also didn't eat much cheese or ice cream. Dr. Victory Dakiniley had started her on Os-Cal, 500 mg twice daily 2 weeks prior to her visit. The child was a very active little girl. She played on a local soccer team. Family history was positive for osteoporosis in the maternal grandmother and maternal great-grandmother. Both were on Fosamax. The maternal great grandmother had also had a fracture. Paternal grandmother  and paternal grandfather had diabetes. Paternal grandfather and maternal grandmother both had thyroid disease.   B. On physical examination her height was at about the 78th percentile. Her weight was at about the 53rd percentile. Her thyroid gland was borderline enlarged. Her right arm was then in a cast. She had normal muscle strength and tone. Laboratory data showed normal thyroid tests. Calcium was 10.5 and PTH was 9.1. Her 25-hydroxy vitamin D was 82. Her 1,25-dihydroxy vitamin D was 33.  Although these laboratory tests were obtained several weeks after beginning of Os-Cal with D, it did not appear as if she had ever had hyperparathyroidism or very severe calcium and vitamin D deficiency. Her BMD study performed on 08/07/04 showed that her total body and spine BMD were normal for her chronologic age. It was possible that she just had had the bad luck to have several fractures of her wrists and forearms as a result of trauma. I decided to follow her clinically over time.   2. During the past 13 years we have followed Nylani for several issues:  A. Her baseline normal bone mineral density has gradually but progressively improved. A follow up bone mineral density study on 08/15/2005 showed that her overall total bone density had improved. The overall total body bone density was at approximately the 50th percentile. Her bone density of the lumbar spine was at about the 80th percentile. A third bone density study on 08/11/2007 showed an even greater improvement in bone mineral density.   B. She did have elevated vitamin D  levels for a period of  time, but the levels normalized once her vitamin D intake was reduced.   C. During this period of time the patient has also had waxing and waning of her thyroid gland size consistent with Hashimoto's thyroiditis. She's also had fluctuations of her thyroid tests consistent with Hashimoto's disease. Although her TFTs have occasionally been borderline low, she has  generally remained euthyroid.   D. She was diagnosed with ADHD several  years ago. She has been treated with Concerta and Intuniv, but the Intuniv was later discontinued.  3. The patient's last [facility] visit was on 12/10/17. I asked her to continue her dietary intake of calcium and to continue taking a good quality MVI.   A. In the interim, she has been healthy. She has not had any more fractures.   B. She is eating and drinking dairy and taking her MVI. She is eating well., but not excessively. She is less physically active during covid-19 times. She is not taking Concerta now, but will resume taking it when she goes back to school.   C. She started taking Lexapro in November 2019 to treat the anxiety attributed to Concerta. She is doing well now.    4. Pertinent Review of Systems:  Constitutional: Bethany Dean feels "good".  She has not been fatigued, but she feels "lazy". 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 Her stamina is good. Energy level is good. Body temperature is normal.  Eyes: Vision is better when she wears her glasses. There are no other recognized eye problems. Neck: She has not had any complaints of anterior neck swelling, soreness, tenderness, pressure, discomfort, or difficulty swallowing.   Heart: Heart rate increases with exercise or other physical activity. The patient has no complaints of palpitations, irregular heart beats, chest pain, or chest pressure.   Gastrointestinal: Bowel movents seem normal. The patient has no complaints of excessive hunger, acid reflux, upset stomach, stomach aches or pains, diarrhea, or constipation.  Legs: Muscle mass and strength seem normal. There are no complaints of numbness, tingling, burning, or pain. No edema is noted.  Feet: There are no obvious foot problems now. There are no complaints of numbness, tingling, burning, or pain. No edema is noted. Neurologic: There are no recognized problems with muscle  movement and strength, sensation, or coordination. GYN: LMP was 3 weeks ago. Periods have been regular for the most part. She is not on any form of birth control.    PAST MEDICAL, FAMILY, AND SOCIAL HISTORY  Past Medical History:  Diagnosis Date  . ADHD (attention deficit hyperactivity disorder)   . Forearm fractures, both bones, closed   . Goiter   . Hashimoto's disease   . Hypervitaminosis D   . Osteopenia   . Physical growth delay   . Physical growth delay   . Thyroiditis, autoimmune     Family History  Problem Relation Age of Onset  . Diabetes Maternal Grandmother   . Osteoporosis Maternal Grandmother   . Hypothyroidism Maternal Grandmother   . Thyroid disease Maternal Grandmother   . Hypothyroidism Maternal Grandfather   . Thyroid disease Maternal Grandfather   . Diabetes Paternal Grandfather      Current Outpatient Medications:  .  methylphenidate 36 MG PO CR tablet, Take 36 mg by mouth daily., Disp: , Rfl:   Allergies as of 12/21/2018  . (No Known Allergies)    reports that she has never smoked. She has never used smokeless  tobacco. She reports that she does not drink alcohol or use drugs. Pediatric History  Patient Parents  . Donnella ShamYates,Dana (Mother)  . Kennis CarinaYates,Barry (Father)   Other Topics Concern  . Not on file  Social History Narrative   Is in 8th grade at [employer]   Lives with parents, and 2 sisters   Plays tennis  .  1. School and family: She graduated early in May from [employer]. 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 Her boyfriend graduated from [employer] workerlaw school and is in the [employer] now. He wants to be an Administrator, sports[employer]y JAG lawyer. Her paternal aunt was previously diagnosed with lupus. Both maternal grandparents had thyroid disease.  2. Activities: She has been very busy, but is not exercising much per se.  3. Primary Care Provider: [facility], [place], [place].  REVIEW OF  SYSTEMS: There are no other significant problems involving Chandria's other body systems.   Objective:  Vital Signs:  There were no vitals taken for this visit.   Ht Readings from Last 3 Encounters:  12/10/17 5' 6.61" (1.692 m) (82 %, Z= 0.91)*  05/21/17 5' 6.34" (1.685 m) (79 %, Z= 0.81)*  11/19/16 5' 6.46" (1.688 m) (81 %, Z= 0.86)*   * Growth percentiles are based on CDC (Girls, 2-20 Years) data.   Wt Readings from Last 3 Encounters:  12/10/17 151 lb 6.4 oz (68.7 kg) (81 %, Z= 0.89)*  05/21/17 144 lb (65.3 kg) (76 %, Z= 0.69)*  11/19/16 127 lb (57.6 kg) (53 %, Z= 0.06)*   * Growth percentiles are based on CDC (Girls, 2-20 Years) data.   There is no height or weight on file to calculate BSA.  PHYSICAL EXAM:  Constitutional: Bethany Dean looks good today and appears healthy and well nourished.  She is bright and alert. She engages well. Her affect and insight are quite normal.  Head: The head is normocephalic. Face: The face appears normal. There are no obvious dysmorphic features.  Eyes: There is no obvious arcus or proptosis. Moisture appears normal. Mouth: The oropharynx and tongue appear normal. Dentition appears to be normal for age. Oral moisture is normal. Neck: The neck appears to be visibly normal.   LAB DATA:   Labs 12/17/18: TSH 2.98, free T4 1.2, free T3 3.2  Labs 11/17/17: TSH 3.89, free T4 1.3, free T3 3.8; PTH 33, calcium 9.9. 25-OH vitamin D 46; CBC normal  Labs 05/19/17: TSH 1.73, free T4 1.4, free T3 3.5; PTH 31, calcium 9.4, 25-OH vitamin D 38; iron 32; CBC normal  Labs 11/14/16: TSH 2.17, free T4 1.3, free T3 3.9; calcium 9.4, PTH 29, 25-OH vitamin D 51;  iron 38 (decreased from 55), CBC normal  Labs 04/24/16: TSH 1.41, free T4 1.4, free T3 3.1  Labs 05/08/15: TSH 2.232, free T4 1.41, free T3 3.1, TPO antibody 52 (normal <9)  Labs 10/27/14: PTH 36, calcium 9.4, 25-OH vitamin D 44; TSH 1.543, free T4 1.36, free T3 3.7  Labs 04/18/14: PTH 40, calcium 9.6,  25-hydroxy vitamin D 45; TSH 2.099, free T4 1.37, free T3 3.9  Labs 01/04/13: Calcium 9.8, PTH 22.7, 25-hydroxy vitamin D 87 (increased from 67); TSH 3.372, free T4 1.41, free T3 4.1  Labs 03/12/12: Free T4 1.21, free T3 4.3, HbA1c 5.5%, PTH 25.7, calcium 10.1, 25-hydroxy vitamin D 49  Labs 07/31/11: TSH 1.746. Free T4 1.54. Free T3 4.2;  PTH 25.2. Calcium 9.9, 25-hydroxy vitamin D 49   Assessment and Plan:   ASSESSMENT:  1-3. Goiter/abnormal thyroid blood tests/thyroiditis:   A. The thyroid gland has varied significantly in size over time, sometimes larger and sometimes smaller. Her thyroid gland was larger at her last visit, but does not appear to be enlarged today. The waxing and waning of thyroid gland size is c/w evolving Hashimoto's thyroiditis.   B. Her TPO antibody in December 2016 was elevated, c/w Hashimoto's disease. Her thyroiditis is clinically quiescent today  C. Her TFTs in the past 4 years have varied from mid-range normal to mildly hypothyroid. Those shifts are also c/w evolving Hashimoto's Dz.    1). Her TSH was mildly elevated at her last visit, her free T4 was a bit lower, and her free T3 was higher. This pattern is c/w a recent flare up of thyroiditis.    2). Her recent TFTs were better, but still only at about the 10-15% of the physiologic range.   D. It is still quite likely that she will become hypothyroid at some time in the future.  4. Weight loss: Resolved  5. Poor appetite: Resolved. Her appetite is sometimes too good now..    6. Hypervitaminosis D:   A. Her vitamin D level has also varied over time, but has been normal for the past 5 years. The vitamin D level was higher at her last visit, but still <50% of the normal vitamin D range.    B. In the past she appeared to be a rapid absorber of vitamin D and/or an active producer of vitamin D when her skin is exposed to the sun. 7. Fractures: This problem has resolved. 8. Fatigue: Resolved at her last visit 9. Pallor:  Resolved at her last visit 10 Iron deficiency anemia: Her CBC was normal again in June 2019. She needs to continue to take an MVI with iron daily, such as Centrum for Women or One-a-Day for Women,  PLAN:  1. Diagnostic:  Repeat the TFTs in 6 months.  Repeat TFTs, vitamin D, CBC, iron in 12 months.  2. Therapeutic: Continue what she is doing in terms of consuming dairy products. I would like her to continue taking an MVI daily to ensure that she has enough calcium and vitamin D to continue to increase her bone density during the young adult years and to ensure that she has enough iron intake to compensate for the loss of iron during menses.  3. Patient education: We talked about the issues of thyroiditis, hypothyroidism, bone mineral accretion, osteoporosis, and iron deficiency anemia.   4. Follow-up: 6 months  Level of Service: This visit lasted in excess of 55 minutes. More than 50% of the visit was devoted to counseling.  Tillman Sers, MD, CDE Adult and Pediatric Endocrinology   This is a Pediatric Specialist E-Visit follow up consult provided via WebEx. Jaclyn Prime consented to an E-Visit consult today.  Location of patient: Dominic is at her home.  Location of provider: Tillman Sers, MD is at his office.  Patient was referred by Alfonse Ras, MD   The following participants were involved in this E-Visit: Ailene Ravel and Dr. Tobe Sos Chief Complain/ Reason for E-Visit today: abnormal thyroid tests, goiter, thyroiditis, hypervitaminosis D, iron deficiency anemia Total time on call: 38 minutes Follow up: one year

## 2018-12-21 NOTE — Patient Instructions (Signed)
Follow up visit in one year. Please repeat the thyroid tests in January 2021. Please repeat the thyroid tests, blood count, iron, and vitamin D about 2 weeks prior to next visit.

## 2018-12-28 ENCOUNTER — Ambulatory Visit (INDEPENDENT_AMBULATORY_CARE_PROVIDER_SITE_OTHER): Payer: PRIVATE HEALTH INSURANCE | Admitting: "Endocrinology

## 2019-07-08 ENCOUNTER — Telehealth (INDEPENDENT_AMBULATORY_CARE_PROVIDER_SITE_OTHER): Payer: Self-pay | Admitting: "Endocrinology

## 2019-07-08 NOTE — Telephone Encounter (Signed)
Who's calling (name and relationship to patient) : Bethany Dean (self)  Best contact number: [phone]  Provider they see: Dr. Fransico Michael  Reason for call:  [name] called in to schedule follow up appt, set for July. PT needing that to be virtual, will be in school at [employer]. Please send her labs to [facility] drawn at [facility] [address] [facility] [address], [place], [place] [address]   Call ID:      PRESCRIPTION REFILL ONLY  Name of prescription:  Pharmacy:

## 2019-11-22 ENCOUNTER — Telehealth (INDEPENDENT_AMBULATORY_CARE_PROVIDER_SITE_OTHER): Payer: Self-pay | Admitting: "Endocrinology

## 2019-11-22 NOTE — Telephone Encounter (Signed)
  Who's calling (name and relationship to patient) : Bethany Dean ( Self)  Best contact number: [phone]  Provider they see: Dr. 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 She is wanting to know  If 1 she can do her labwork and the [facility] labs there. I thought yes but wanted to verify and ask about sending orders. 2 If she could have her appointment scheduled for 2 weeks be a Virtual appointment because of the long drive back for the appointment? Please Advise Thank you     PRESCRIPTION REFILL ONLY  Name of prescription:  Pharmacy:

## 2019-11-23 NOTE — Telephone Encounter (Signed)
Hi yes I will call her right away

## 2019-11-23 NOTE — Telephone Encounter (Signed)
Hey can you schedule her a virtual please

## 2019-12-01 LAB — T4, FREE: Free T4: 1.2 ng/dL (ref 0.8–1.8)

## 2019-12-01 LAB — CBC WITH DIFFERENTIAL/PLATELET
Absolute Monocytes: 320 cells/uL (ref 200–950)
Basophils Absolute: 9 cells/uL (ref 0–200)
Basophils Relative: 0.2 %
Eosinophils Absolute: 188 cells/uL (ref 15–500)
Eosinophils Relative: 4 %
HCT: 41.8 % (ref 35.0–45.0)
Hemoglobin: 14.2 g/dL (ref 11.7–15.5)
Lymphs Abs: 1429 cells/uL (ref 850–3900)
MCH: 30.1 pg (ref 27.0–33.0)
MCHC: 34 g/dL (ref 32.0–36.0)
MCV: 88.7 fL (ref 80.0–100.0)
MPV: 9 fL (ref 7.5–12.5)
Monocytes Relative: 6.8 %
Neutro Abs: 2754 cells/uL (ref 1500–7800)
Neutrophils Relative %: 58.6 %
Platelets: 273 10*3/uL (ref 140–400)
RBC: 4.71 10*6/uL (ref 3.80–5.10)
RDW: 12.7 % (ref 11.0–15.0)
Total Lymphocyte: 30.4 %
WBC: 4.7 10*3/uL (ref 3.8–10.8)

## 2019-12-01 LAB — TSH: TSH: 3.25 mIU/L

## 2019-12-01 LAB — VITAMIN D 25 HYDROXY (VIT D DEFICIENCY, FRACTURES): Vit D, 25-Hydroxy: 47 ng/mL (ref 30–100)

## 2019-12-01 LAB — T3, FREE: T3, Free: 3.4 pg/mL (ref 2.3–4.2)

## 2019-12-01 LAB — IRON: Iron: 56 ug/dL (ref 40–190)

## 2019-12-07 ENCOUNTER — Encounter (INDEPENDENT_AMBULATORY_CARE_PROVIDER_SITE_OTHER): Payer: Self-pay | Admitting: "Endocrinology

## 2019-12-07 ENCOUNTER — Other Ambulatory Visit: Payer: Self-pay

## 2019-12-07 ENCOUNTER — Telehealth (INDEPENDENT_AMBULATORY_CARE_PROVIDER_SITE_OTHER): Payer: PRIVATE HEALTH INSURANCE | Admitting: "Endocrinology

## 2019-12-07 DIAGNOSIS — E049 Nontoxic goiter, unspecified: Secondary | ICD-10-CM | POA: Diagnosis not present

## 2019-12-07 DIAGNOSIS — E559 Vitamin D deficiency, unspecified: Secondary | ICD-10-CM | POA: Diagnosis not present

## 2019-12-07 DIAGNOSIS — R7989 Other specified abnormal findings of blood chemistry: Secondary | ICD-10-CM | POA: Diagnosis not present

## 2019-12-07 DIAGNOSIS — E063 Autoimmune thyroiditis: Secondary | ICD-10-CM | POA: Diagnosis not present

## 2019-12-07 DIAGNOSIS — D508 Other iron deficiency anemias: Secondary | ICD-10-CM

## 2019-12-07 NOTE — Progress Notes (Signed)
Subjective:  Patient Name: Bethany Dean Date of Birth: [birth year]  MRN: [id number]  Bethany Dean  presents for her WebEx visit  today for follow-up of her previous history of fractures, goiter, abnormal thyroid tests, fatigue, thyroiditis, hypervitaminosis D,  and iron deficiency anemia.  HISTORY OF PRESENT ILLNESS:   Bethany Dean is a 24 y.o. Caucasian young lady.  Esty was unaccompanied.  1. Patient was first referred to me on 07/23/2004 by her primary care pediatrician, Dr. Darrin Nipper in [place], [place], after having had four fractures within 18 months in the setting of a family history of osteoporosis.   A. The child was then 45-1/2 years old. The perinatal history showed that this was the third child for this mother. Child was born at term with a birth weight of 9 lbs. 13 oz. She took vitamin D supplementation while she was breast fed until age 68-1/2 months, but then was without any multivitamins or vitamin D at all. Her first fracture occurred when she was 24 years old. This was a fracture of the left radius, which occurred after a fall from gymnastic rings. She had a second fracture at the original site 3 months later during a sliding accident. She had a third fracture when she fell backward off a chair and fractured her left wrist. Her fourth fracture within 18 months occurred when she tripped on a post and fell forward, fracturing her right ulna. Her dietary intake of calcium had been low, because she had been chronically constipated since infancy. She didn't drink much milk. She also didn't eat much cheese or ice cream. Dr. Victory Dakin had started her on Os-Cal, 500 mg twice daily 2 weeks prior to her visit. The child was a very active little girl. She played on a local soccer team.   B. Family history was positive for osteoporosis in the maternal grandmother and maternal great-grandmother. Both were on Fosamax. The maternal great grandmother had also had a fracture. Paternal  grandmother and paternal grandfather had diabetes. Paternal grandfather and maternal grandmother both had thyroid disease.   C. On physical examination her height was at about the 78th percentile. Her weight was at about the 53rd percentile. Her thyroid gland was borderline enlarged. Her right arm was then in a cast. She had normal muscle strength and tone. Laboratory data showed normal thyroid tests. Calcium was 10.5 and PTH was 9.1. Her 25-hydroxy vitamin D was 82. Her 1,25-dihydroxy vitamin D was 33.  Although these laboratory tests were obtained several weeks after beginning of Os-Cal with D, it did not appear as if she had ever had hyperparathyroidism or very severe calcium and vitamin D deficiency. Her BMD study performed on 08/07/04 showed that her total body and spine BMD were normal for her chronologic age. It was possible that she just had had the bad luck to have several fractures of her wrists and forearms as a result of trauma. I decided to follow her clinically over time.   2. During the past 15 years we have followed Bethany Dean for several issues:  A. Her baseline normal bone mineral density has gradually but progressively improved. A follow up bone mineral density study on 08/15/2005 showed that her overall total bone density had improved. The overall total body bone density was at approximately the 50th percentile. Her bone density of the lumbar spine was at about the 80th percentile. A third bone density study on 08/11/2007 showed an even greater improvement in bone mineral density.   B. She did have  elevated vitamin D levels for a period of  time, but the levels normalized once her vitamin D intake was reduced.   C. During this period of time the patient has also had waxing and waning of her thyroid gland size consistent with Hashimoto's thyroiditis. She's also had fluctuations of her thyroid tests consistent with Hashimoto's disease. Although her TFTs have occasionally been borderline low, she  has generally remained euthyroid.   D. She was diagnosed with ADHD several  years ago. She has been treated with Concerta and Intuniv, but the Intuniv was later discontinued.  E. She was also diagnosed with anxiety several years ago and has been taking Lexapro since November 2019.   3. The patient's last [facility] televisit was on 12/21/18. I asked her to continue her dietary intake of calcium and to continue taking a good quality MVI. I also asked her to repeat her TFTs in 6 months and to make a follow up appointment in 6 months.   A. In the interim, she has been healthy. She has not had any more fractures.   B. She is eating and drinking dairy and taking her MVI. She is eating well., but not excessively. She is still taking Concerta and Lexapro. She feels well physically and emotionally.     4. Pertinent Review of Systems:  Constitutional: Bethany Dean feels "pretty good".  She has not been fatigued. Her stamina is good. Energy level is good. Body temperature is normal.  Eyes: Vision is better when she wears her glasses or contacts. There are no other recognized eye problems. Neck: She has not had any complaints of anterior neck swelling, soreness, tenderness, pressure, discomfort, or difficulty swallowing.   Heart: Heart rate increases with exercise or other physical activity. The patient has no complaints of palpitations, irregular heart beats, chest pain, or chest pressure. Gastrointestinal: Bowel movents seem normal. The patient has no complaints of excessive hunger, acid reflux, upset stomach, stomach aches or pains, diarrhea, or constipation.  Hands: No tremor Legs: Muscle mass and strength seem normal. There are no complaints of numbness, tingling, burning, or pain. No edema is noted.  Feet: There are no obvious foot problems now. There are no complaints of numbness, tingling, burning, or pain. No edema is noted. Neurologic: There are no recognized problems with muscle movement and strength,  sensation, or coordination. GYN: LMP was 2 weeks ago. Periods have been regular for the most part. She is not on any form of birth control.    PAST MEDICAL, FAMILY, AND SOCIAL HISTORY  Past Medical History:  Diagnosis Date  . ADHD (attention deficit hyperactivity disorder)   . Anxiety    Bethany Dean 12/04/2019  . Forearm fractures, both bones, closed   . Goiter   . Hashimoto's disease   . Hypervitaminosis D   . Osteopenia   . Physical growth delay   . Physical growth delay   . Thyroid disease    Bethany Dean 12/04/2019  . Thyroiditis, autoimmune     Family History  Problem Relation Age of Onset  . Diabetes Maternal Grandmother   . Osteoporosis Maternal Grandmother   . Hypothyroidism Maternal Grandmother   . Thyroid disease Maternal Grandmother   . Hypothyroidism Maternal Grandfather   . Thyroid disease Maternal Grandfather   . Diabetes Paternal Grandfather      Current Outpatient Medications:  .  methylphenidate 36 MG PO CR tablet, Take 36 mg by mouth daily., Disp: , Rfl:   Allergies as of 12/07/2019  . (No Known Allergies)  reports that she has never smoked. She has never used smokeless tobacco. She reports that she does not drink alcohol and does not use drugs. Pediatric History  Patient Parents  . Donnella ShamYates,Dana (Mother)  . Kennis CarinaYates,Barry (Father)   Other Topics Concern  . Not on file  Social History Narrative   Is in 8th grade at [employer]   Lives with parents, and 2 sisters   Plays tennis  .  1. School and family: She graduated early from [employer] in May 2020. 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 She broke up with her boyfriend.  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 Her paternal aunt was previously diagnosed with lupus. Both maternal grandparents had thyroid disease.  2. Activities: She has been very busy, but is not exercising much per se.  3. Primary Care Provider: [facility], [place], [place].  REVIEW OF SYSTEMS: There are no other significant problems involving Bethany Dean's other body systems.   Objective:  Vital Signs:  There were no vitals taken for this visit.   Ht Readings from Last 3 Encounters:  12/10/17 5' 6.61" (1.692 m) (82 %, Z= 0.91)*  05/21/17 5' 6.34" (1.685 m) (79 %, Z= 0.81)*  11/19/16 5' 6.46" (1.688 m) (81 %, Z= 0.86)*   * Growth percentiles are based on CDC (Girls, 2-20 Years) data.   Wt Readings from Last 3 Encounters:  12/10/17 151 lb 6.4 oz (68.7 kg) (81 %, Z= 0.89)*  05/21/17 144 lb (65.3 kg) (76 %, Z= 0.69)*  11/19/16 127 lb (57.6 kg) (53 %, Z= 0.06)*   * Growth percentiles are based on CDC (Girls, 2-20 Years) data.   There is no height or weight on file to calculate BSA.   She has not weighed herself recently.   PHYSICAL EXAM:  Constitutional: Bethany Dean looks good today and appears healthy and well nourished.  She is bright and alert. She engages well. Her affect and insight are quite normal.  Head: The head is normocephalic. Face: The face appears normal. There are no obvious dysmorphic features.  Eyes: There is no obvious arcus or proptosis. Moisture appears normal. Mouth: The oropharynx and tongue appear normal. Dentition appears to be normal for age. Oral moisture is normal. Neck: The neck appears to be visibly normal.   LAB DATA:   Labs 11/30/19: TSH 3.25, free T4 1.2, free T3 3.4; CBC normal; iron 56 (ref 40-190), 25-OH vitamin D 47  Labs 12/17/18: TSH 2.98, free T4 1.2, free T3 3.2  Labs 11/17/17: TSH 3.89, free T4 1.3, free T3 3.8; PTH 33, calcium 9.9. 25-OH vitamin D 46; CBC normal  Labs 05/19/17: TSH 1.73, free T4 1.4, free T3 3.5; PTH 31, calcium 9.4, 25-OH vitamin D 38; iron 32; CBC normal  Labs 11/14/16: TSH 2.17, free T4 1.3, free T3 3.9; calcium 9.4, PTH 29, 25-OH vitamin D 51;  iron 38 (decreased from 55), CBC normal  Labs 04/24/16: TSH 1.41, free T4 1.4, free T3 3.1  Labs 05/08/15:  TSH 2.232, free T4 1.41, free T3 3.1, TPO antibody 52 (normal <9)  Labs 10/27/14: PTH 36, calcium 9.4, 25-OH vitamin D 44; TSH 1.543, free T4 1.36, free T3 3.7  Labs 04/18/14: PTH 40, calcium 9.6, 25-hydroxy vitamin D 45; TSH 2.099, free T4 1.37, free T3 3.9  Labs 01/04/13: Calcium 9.8, PTH 22.7, 25-hydroxy vitamin D 87 (increased from 67);  TSH 3.372, free T4 1.41, free T3 4.1  Labs 03/12/12: Free T4 1.21, free T3 4.3, HbA1c 5.5%, PTH 25.7, calcium 10.1, 25-hydroxy vitamin D 49  Labs 07/31/11: TSH 1.746. Free T4 1.54. Free T3 4.2;  PTH 25.2. Calcium 9.9, 25-hydroxy vitamin D 49   Assessment and Plan:   ASSESSMENT:  1-3. Goiter/abnormal thyroid blood tests/thyroiditis:   A. The thyroid gland has varied significantly in size over time, sometimes larger and sometimes smaller. Her thyroid gland was mildly enlarged at her clinic visit on 12/10/17, but did not appear enlarged at her televisits in July 2020 or today in July 2021. The waxing and waning of thyroid gland size is c/w evolving Hashimoto's thyroiditis.   B. Her TPO antibody in December 2016 was elevated, c/w Hashimoto's disease. Her thyroiditis is clinically quiescent today  C. Her TFTs in the past 5 years have varied from mid-range normal to mildly hypothyroid. Those shifts are also c/w evolving Hashimoto's Dz.    1). Her TSH was mildly elevated at her June 2019 visit, her free T4 was a bit lower, and her free T3 was higher. This pattern was c/w a recent flare up of thyroiditis.    2). Her recent TFTs in July 2020 were better, but still only at about the 10-15% of the physiologic range.    3). Her TFTs in June 2021 were normal, but low-normal.  D. It is still quite likely that she will become hypothyroid at some time in the future.  4. Weight loss: She feel s that her weight is about the same. She tends to lose weight during the school year and gain weight during the summer months.  5. Poor appetite: Resolved. Her appetite is sometimes too  good now..    6. Hypervitaminosis D:   A. Her vitamin D level has also varied over time, but has been normal for the past 5 years. The vitamin D level was normal in June 2021, but still <50% of the normal vitamin D range.    B. In the past she appeared to be a rapid absorber of vitamin D and/or an active producer of vitamin D when her skin is exposed to the sun. 7. Fractures: This problem has resolved. 8. Fatigue: Resolved at her July 2019 visit 9. Pallor: Resolved at her last visit 10 Iron deficiency anemia: Her CBC was normal again in June 2019. She needs to continue to take an MVI with iron daily, such as Centrum for Women or One-a-Day for Women,  PLAN:  1. Diagnostic:  Repeat the TFTs in 6 months.  Repeat TFTs, vitamin D in 12 months.  2. Therapeutic: Continue what she is doing in terms of consuming dairy products. I would like her to continue taking an MVI daily to ensure that she has enough calcium and vitamin D to continue to increase her bone density during the young adult years and to ensure that she has enough iron intake to compensate for the loss of iron during menses.  3. Patient education: We talked about the issues of thyroiditis, hypothyroidism, bone mineral accretion, osteoporosis, and iron deficiency anemia.   4. Follow-up: 12 months  Level of Service: This visit lasted in excess of 45 minutes. More than 50% of the visit was devoted to counseling.  Molli Knock, MD, CDE Adult and Pediatric Endocrinology   This is a Pediatric Specialist E-Visit follow up consult provided via WebEx. Daisy Blossom consented to an E-Visit consult today.  Location of patient: Bethany Dean is at  her home.  Location of provider: Molli Knock, MD is at his office.  Patient was referred by Chapman Moss, MD   The following participants were involved in this E-Visit: Bethany Nimrod and Dr. Fransico Akeila Lana Chief Complain/ Reason for E-Visit today: abnormal thyroid tests, goiter, thyroiditis,  hypervitaminosis D, iron deficiency anemia Total time on call: 35 minutes Follow up: one year

## 2019-12-07 NOTE — Patient Instructions (Signed)
Follow up visit in 12 months. Please repeat thyroid tests in 6 months and thyroid tests, vitamin D, PTH, Calcium, CBC, and iron about two weeks prior to next visit.

## 2020-12-25 ENCOUNTER — Other Ambulatory Visit (INDEPENDENT_AMBULATORY_CARE_PROVIDER_SITE_OTHER): Payer: Self-pay

## 2020-12-25 ENCOUNTER — Telehealth (INDEPENDENT_AMBULATORY_CARE_PROVIDER_SITE_OTHER): Payer: Self-pay | Admitting: "Endocrinology

## 2020-12-25 DIAGNOSIS — D508 Other iron deficiency anemias: Secondary | ICD-10-CM

## 2020-12-25 DIAGNOSIS — E559 Vitamin D deficiency, unspecified: Secondary | ICD-10-CM

## 2020-12-25 DIAGNOSIS — E063 Autoimmune thyroiditis: Secondary | ICD-10-CM

## 2020-12-25 DIAGNOSIS — R7989 Other specified abnormal findings of blood chemistry: Secondary | ICD-10-CM

## 2020-12-25 DIAGNOSIS — E049 Nontoxic goiter, unspecified: Secondary | ICD-10-CM

## 2020-12-25 NOTE — Telephone Encounter (Signed)
  Who's calling (name and relationship to patient) :Self/ Clarisa Schools   Best contact number:[phone]  Provider they see:Dr. Fransico Michael   Reason for call:caller stated that she wanted to cancel her appointment with Dr. Fransico Michael and let clinic staff know that she will be receiving care with Adult Endo.      PRESCRIPTION REFILL ONLY  Name of prescription:  Pharmacy:

## 2021-01-09 ENCOUNTER — Ambulatory Visit (INDEPENDENT_AMBULATORY_CARE_PROVIDER_SITE_OTHER): Payer: PRIVATE HEALTH INSURANCE | Admitting: "Endocrinology

## 2021-07-04 NOTE — Telephone Encounter (Signed)
Patient needs referral with recent notes and labs sent to Dr. Sharl Ma at [facility]. Please call patient when referral has been sent - [phone]

## 2021-07-06 NOTE — Telephone Encounter (Signed)
Put in referral. Released notes. Called patient to let her know.

## 2021-07-12 ENCOUNTER — Telehealth (INDEPENDENT_AMBULATORY_CARE_PROVIDER_SITE_OTHER): Payer: Self-pay | Admitting: "Endocrinology

## 2021-07-12 NOTE — Telephone Encounter (Signed)
°  Who's calling (name and relationship to patient) :Mother / Vicenta Aly contact number:[phone]   Provider they see:Dr. Fransico Michael   Reason for call:caller stated that the referral that was supposed to be sent to Dr. Sharl Ma at [facility] has not been sent in. Mom has asked if it can be faxed to [phone]. The doctors office asked as well.mom asked if this can be taken care of as soon as possible so she can get her scheduled      PRESCRIPTION REFILL ONLY  Name of prescription:  Pharmacy:

## 2021-07-13 NOTE — Telephone Encounter (Signed)
Referral manually faxed to Dr Daune Perch office.

## 2021-11-30 ENCOUNTER — Other Ambulatory Visit (HOSPITAL_BASED_OUTPATIENT_CLINIC_OR_DEPARTMENT_OTHER): Payer: Self-pay

## 2022-01-03 ENCOUNTER — Encounter (INDEPENDENT_AMBULATORY_CARE_PROVIDER_SITE_OTHER): Payer: Self-pay

## 2022-01-29 ENCOUNTER — Ambulatory Visit: Payer: Self-pay | Admitting: Internal Medicine
